# Patient Record
Sex: Male | Born: 1950 | Race: White | Hispanic: No
Health system: Southern US, Community
[De-identification: ages and names within clinical notes are randomized; demographics above are authoritative.]

---

## 2014-03-12 ENCOUNTER — Institutional Professional Consult (permissible substitution)
Admission: AD | Admit: 2014-03-12 | Discharge: 2014-04-10 | Disposition: A | Discharge status: Skilled Nursing Facility | Payer: Self-pay | Source: Ambulatory Visit | Attending: Internal Medicine | Admitting: Internal Medicine

## 2014-03-13 ENCOUNTER — Other Ambulatory Visit (HOSPITAL_COMMUNITY): Payer: Self-pay

## 2014-03-13 LAB — COMPREHENSIVE METABOLIC PANEL
ALT: 11 U/L (ref 0–53)
AST: 10 U/L (ref 0–37)
Albumin: 2.8 g/dL — ABNORMAL LOW (ref 3.5–5.2)
Alkaline Phosphatase: 84 U/L (ref 39–117)
BUN: 61 mg/dL — ABNORMAL HIGH (ref 6–23)
CALCIUM: 8.3 mg/dL — AB (ref 8.4–10.5)
CO2: 19 mEq/L (ref 19–32)
CREATININE: 2.7 mg/dL — AB (ref 0.50–1.35)
Chloride: 98 mEq/L (ref 96–112)
GFR calc Af Amer: 28 mL/min — ABNORMAL LOW (ref >90)
GFR calc non Af Amer: 24 mL/min — ABNORMAL LOW (ref >90)
Glucose, Bld: 106 mg/dL — ABNORMAL HIGH (ref 70–99)
Potassium: 3.5 mEq/L — ABNORMAL LOW (ref 3.7–5.3)
Sodium: 137 mEq/L (ref 137–147)
TOTAL PROTEIN: 6.3 g/dL (ref 6.0–8.3)
Total Bilirubin: 0.3 mg/dL (ref 0.3–1.2)

## 2014-03-13 LAB — CBC
HCT: 26.9 % — ABNORMAL LOW (ref 39.0–52.0)
Hemoglobin: 8.9 g/dL — ABNORMAL LOW (ref 13.0–17.0)
MCH: 30.4 pg (ref 26.0–34.0)
MCHC: 33.1 g/dL (ref 30.0–36.0)
MCV: 91.8 fL (ref 78.0–100.0)
PLATELETS: 238 10*3/uL (ref 150–400)
RBC: 2.93 MIL/uL — ABNORMAL LOW (ref 4.22–5.81)
RDW: 15.9 % — ABNORMAL HIGH (ref 11.5–15.5)
WBC: 8.2 10*3/uL (ref 4.0–10.5)

## 2014-03-13 LAB — PROCALCITONIN: PROCALCITONIN: 0.14 ng/mL

## 2014-03-13 LAB — TSH: TSH: 1.79 u[IU]/mL (ref 0.350–4.500)

## 2014-03-14 LAB — BASIC METABOLIC PANEL
BUN: 56 mg/dL — ABNORMAL HIGH (ref 6–23)
CO2: 22 mEq/L (ref 19–32)
Calcium: 8.2 mg/dL — ABNORMAL LOW (ref 8.4–10.5)
Chloride: 101 mEq/L (ref 96–112)
Creatinine, Ser: 2.84 mg/dL — ABNORMAL HIGH (ref 0.50–1.35)
GFR calc Af Amer: 26 mL/min — ABNORMAL LOW (ref >90)
GFR calc non Af Amer: 22 mL/min — ABNORMAL LOW (ref >90)
GLUCOSE: 76 mg/dL (ref 70–99)
POTASSIUM: 3.7 meq/L (ref 3.7–5.3)
Sodium: 139 mEq/L (ref 137–147)

## 2014-03-14 LAB — PRO B NATRIURETIC PEPTIDE: PRO B NATRI PEPTIDE: 41834 pg/mL — AB (ref 0–125)

## 2014-03-15 ENCOUNTER — Other Ambulatory Visit (HOSPITAL_COMMUNITY): Payer: Self-pay

## 2014-03-15 LAB — BASIC METABOLIC PANEL
BUN: 53 mg/dL — ABNORMAL HIGH (ref 6–23)
BUN: 55 mg/dL — AB (ref 6–23)
CALCIUM: 8.4 mg/dL (ref 8.4–10.5)
CHLORIDE: 100 meq/L (ref 96–112)
CO2: 21 meq/L (ref 19–32)
CO2: 22 mEq/L (ref 19–32)
CREATININE: 3.04 mg/dL — AB (ref 0.50–1.35)
Calcium: 8.2 mg/dL — ABNORMAL LOW (ref 8.4–10.5)
Chloride: 101 mEq/L (ref 96–112)
Creatinine, Ser: 3.24 mg/dL — ABNORMAL HIGH (ref 0.50–1.35)
GFR calc non Af Amer: 21 mL/min — ABNORMAL LOW (ref >90)
GFR, EST AFRICAN AMERICAN: 24 mL/min — AB (ref >90)
GFR, EST AFRICAN AMERICAN: 22 mL/min — AB (ref >90)
GFR, EST NON AFRICAN AMERICAN: 19 mL/min — AB (ref >90)
Glucose, Bld: 132 mg/dL — ABNORMAL HIGH (ref 70–99)
Glucose, Bld: 109 mg/dL — ABNORMAL HIGH (ref 70–99)
POTASSIUM: 3.6 meq/L — AB (ref 3.7–5.3)
Potassium: 3.8 mEq/L (ref 3.7–5.3)
SODIUM: 138 meq/L (ref 137–147)
Sodium: 140 mEq/L (ref 137–147)

## 2014-03-15 LAB — CBC
HCT: 25.8 % — ABNORMAL LOW (ref 39.0–52.0)
Hemoglobin: 8.5 g/dL — ABNORMAL LOW (ref 13.0–17.0)
MCH: 30.5 pg (ref 26.0–34.0)
MCHC: 32.9 g/dL (ref 30.0–36.0)
MCV: 92.5 fL (ref 78.0–100.0)
Platelets: 228 10*3/uL (ref 150–400)
RBC: 2.79 MIL/uL — AB (ref 4.22–5.81)
RDW: 16.1 % — ABNORMAL HIGH (ref 11.5–15.5)
WBC: 5.4 10*3/uL (ref 4.0–10.5)

## 2014-03-16 LAB — FERRITIN: Ferritin: 436 ng/mL — ABNORMAL HIGH (ref 22–322)

## 2014-03-16 LAB — BASIC METABOLIC PANEL
BUN: 52 mg/dL — AB (ref 6–23)
CO2: 20 mEq/L (ref 19–32)
Calcium: 8.2 mg/dL — ABNORMAL LOW (ref 8.4–10.5)
Chloride: 102 mEq/L (ref 96–112)
Creatinine, Ser: 3.04 mg/dL — ABNORMAL HIGH (ref 0.50–1.35)
GFR calc Af Amer: 24 mL/min — ABNORMAL LOW (ref >90)
GFR, EST NON AFRICAN AMERICAN: 21 mL/min — AB (ref >90)
GLUCOSE: 144 mg/dL — AB (ref 70–99)
Potassium: 3.9 mEq/L (ref 3.7–5.3)
Sodium: 139 mEq/L (ref 137–147)

## 2014-03-16 LAB — URINALYSIS, ROUTINE W REFLEX MICROSCOPIC
BILIRUBIN URINE: NEGATIVE
Glucose, UA: 100 mg/dL — AB
Hgb urine dipstick: NEGATIVE
Ketones, ur: NEGATIVE mg/dL
Leukocytes, UA: NEGATIVE
Nitrite: NEGATIVE
PH: 6 (ref 5.0–8.0)
Protein, ur: >300 mg/dL — AB
Specific Gravity, Urine: 1.02 (ref 1.005–1.030)
UROBILINOGEN UA: 0.2 mg/dL (ref 0.0–1.0)

## 2014-03-16 LAB — IRON AND TIBC
IRON: 64 ug/dL (ref 42–135)
Saturation Ratios: 22 % (ref 20–55)
TIBC: 287 ug/dL (ref 215–435)
UIBC: 223 ug/dL (ref 125–400)

## 2014-03-16 LAB — URINE MICROSCOPIC-ADD ON

## 2014-03-17 LAB — PROTEIN ELECTROPHORESIS, SERUM
ALBUMIN ELP: 54.8 % — AB (ref 55.8–66.1)
ALPHA-1-GLOBULIN: 7.2 % — AB (ref 2.9–4.9)
ALPHA-2-GLOBULIN: 12.3 % — AB (ref 7.1–11.8)
BETA 2: 5.4 % (ref 3.2–6.5)
BETA GLOBULIN: 6 % (ref 4.7–7.2)
GAMMA GLOBULIN: 14.3 % (ref 11.1–18.8)
M-Spike, %: NOT DETECTED g/dL
Total Protein ELP: 5.9 g/dL — ABNORMAL LOW (ref 6.0–8.3)

## 2014-03-17 LAB — URINE CULTURE
COLONY COUNT: NO GROWTH
CULTURE: NO GROWTH

## 2014-03-17 LAB — BASIC METABOLIC PANEL
BUN: 52 mg/dL — AB (ref 6–23)
CO2: 19 mEq/L (ref 19–32)
CREATININE: 3.16 mg/dL — AB (ref 0.50–1.35)
Calcium: 8.3 mg/dL — ABNORMAL LOW (ref 8.4–10.5)
Chloride: 103 mEq/L (ref 96–112)
GFR calc Af Amer: 23 mL/min — ABNORMAL LOW (ref >90)
GFR, EST NON AFRICAN AMERICAN: 20 mL/min — AB (ref >90)
GLUCOSE: 124 mg/dL — AB (ref 70–99)
Potassium: 4 mEq/L (ref 3.7–5.3)
Sodium: 139 mEq/L (ref 137–147)

## 2014-03-17 LAB — PREALBUMIN: Prealbumin: 29 mg/dL (ref 17.0–34.0)

## 2014-03-18 LAB — BASIC METABOLIC PANEL
BUN: 52 mg/dL — AB (ref 6–23)
CHLORIDE: 103 meq/L (ref 96–112)
CO2: 21 mEq/L (ref 19–32)
Calcium: 8.5 mg/dL (ref 8.4–10.5)
Creatinine, Ser: 3.17 mg/dL — ABNORMAL HIGH (ref 0.50–1.35)
GFR calc Af Amer: 23 mL/min — ABNORMAL LOW (ref >90)
GFR, EST NON AFRICAN AMERICAN: 20 mL/min — AB (ref >90)
GLUCOSE: 95 mg/dL (ref 70–99)
POTASSIUM: 3.7 meq/L (ref 3.7–5.3)
SODIUM: 140 meq/L (ref 137–147)

## 2014-03-19 LAB — CBC WITH DIFFERENTIAL/PLATELET
Basophils Absolute: 0 10*3/uL (ref 0.0–0.1)
Basophils Relative: 0 % (ref 0–1)
EOS ABS: 0.1 10*3/uL (ref 0.0–0.7)
Eosinophils Relative: 3 % (ref 0–5)
HCT: 24.6 % — ABNORMAL LOW (ref 39.0–52.0)
HEMOGLOBIN: 8 g/dL — AB (ref 13.0–17.0)
LYMPHS ABS: 0.7 10*3/uL (ref 0.7–4.0)
LYMPHS PCT: 15 % (ref 12–46)
MCH: 30.2 pg (ref 26.0–34.0)
MCHC: 32.5 g/dL (ref 30.0–36.0)
MCV: 92.8 fL (ref 78.0–100.0)
MONOS PCT: 8 % (ref 3–12)
Monocytes Absolute: 0.4 10*3/uL (ref 0.1–1.0)
Neutro Abs: 3.4 10*3/uL (ref 1.7–7.7)
Neutrophils Relative %: 74 % (ref 43–77)
PLATELETS: 200 10*3/uL (ref 150–400)
RBC: 2.65 MIL/uL — AB (ref 4.22–5.81)
RDW: 16.1 % — ABNORMAL HIGH (ref 11.5–15.5)
WBC: 4.6 10*3/uL (ref 4.0–10.5)

## 2014-03-19 LAB — UIFE/LIGHT CHAINS/TP QN, 24-HR UR
ALPHA 1 UR: DETECTED — AB
Albumin, U: DETECTED
Alpha 2, Urine: DETECTED — AB
BETA UR: DETECTED — AB
FREE KAPPA LT CHAINS, UR: 21 mg/dL — AB (ref 0.14–2.42)
Free Kappa/Lambda Ratio: 8.02 ratio (ref 2.04–10.37)
Free Lambda Lt Chains,Ur: 2.62 mg/dL — ABNORMAL HIGH (ref 0.02–0.67)
GAMMA UR: DETECTED — AB
Total Protein, Urine: 204.3 mg/dL

## 2014-03-19 LAB — RENAL FUNCTION PANEL
ALBUMIN: 2.7 g/dL — AB (ref 3.5–5.2)
BUN: 46 mg/dL — ABNORMAL HIGH (ref 6–23)
CHLORIDE: 103 meq/L (ref 96–112)
CO2: 20 meq/L (ref 19–32)
CREATININE: 3.01 mg/dL — AB (ref 0.50–1.35)
Calcium: 8.6 mg/dL (ref 8.4–10.5)
GFR calc Af Amer: 24 mL/min — ABNORMAL LOW (ref >90)
GFR calc non Af Amer: 21 mL/min — ABNORMAL LOW (ref >90)
Glucose, Bld: 115 mg/dL — ABNORMAL HIGH (ref 70–99)
POTASSIUM: 4.1 meq/L (ref 3.7–5.3)
Phosphorus: 4.2 mg/dL (ref 2.3–4.6)
Sodium: 140 mEq/L (ref 137–147)

## 2014-03-21 ENCOUNTER — Other Ambulatory Visit (HOSPITAL_COMMUNITY): Payer: Self-pay

## 2014-03-21 LAB — BASIC METABOLIC PANEL
BUN: 47 mg/dL — ABNORMAL HIGH (ref 6–23)
CALCIUM: 9.3 mg/dL (ref 8.4–10.5)
CO2: 18 meq/L — AB (ref 19–32)
CREATININE: 2.88 mg/dL — AB (ref 0.50–1.35)
Chloride: 102 mEq/L (ref 96–112)
GFR calc Af Amer: 26 mL/min — ABNORMAL LOW (ref >90)
GFR calc non Af Amer: 22 mL/min — ABNORMAL LOW (ref >90)
GLUCOSE: 109 mg/dL — AB (ref 70–99)
Potassium: 3.9 mEq/L (ref 3.7–5.3)
Sodium: 137 mEq/L (ref 137–147)

## 2014-03-21 LAB — CBC WITH DIFFERENTIAL/PLATELET
BASOS ABS: 0 10*3/uL (ref 0.0–0.1)
Basophils Relative: 0 % (ref 0–1)
EOS PCT: 3 % (ref 0–5)
Eosinophils Absolute: 0.2 10*3/uL (ref 0.0–0.7)
HCT: 26.8 % — ABNORMAL LOW (ref 39.0–52.0)
HEMOGLOBIN: 8.7 g/dL — AB (ref 13.0–17.0)
LYMPHS ABS: 0.8 10*3/uL (ref 0.7–4.0)
Lymphocytes Relative: 14 % (ref 12–46)
MCH: 30.3 pg (ref 26.0–34.0)
MCHC: 32.5 g/dL (ref 30.0–36.0)
MCV: 93.4 fL (ref 78.0–100.0)
MONO ABS: 0.3 10*3/uL (ref 0.1–1.0)
MONOS PCT: 5 % (ref 3–12)
NEUTROS ABS: 4.5 10*3/uL (ref 1.7–7.7)
Neutrophils Relative %: 78 % — ABNORMAL HIGH (ref 43–77)
Platelets: 253 10*3/uL (ref 150–400)
RBC: 2.87 MIL/uL — AB (ref 4.22–5.81)
RDW: 16 % — ABNORMAL HIGH (ref 11.5–15.5)
WBC: 5.8 10*3/uL (ref 4.0–10.5)

## 2014-03-21 LAB — MAGNESIUM: Magnesium: 2.1 mg/dL (ref 1.5–2.5)

## 2014-03-21 LAB — PHOSPHORUS: Phosphorus: 4.1 mg/dL (ref 2.3–4.6)

## 2014-03-22 ENCOUNTER — Other Ambulatory Visit (HOSPITAL_COMMUNITY): Payer: Self-pay

## 2014-03-22 LAB — BASIC METABOLIC PANEL
BUN: 48 mg/dL — AB (ref 6–23)
CHLORIDE: 100 meq/L (ref 96–112)
CO2: 21 mEq/L (ref 19–32)
Calcium: 9 mg/dL (ref 8.4–10.5)
Creatinine, Ser: 2.86 mg/dL — ABNORMAL HIGH (ref 0.50–1.35)
GFR, EST AFRICAN AMERICAN: 26 mL/min — AB (ref >90)
GFR, EST NON AFRICAN AMERICAN: 22 mL/min — AB (ref >90)
GLUCOSE: 108 mg/dL — AB (ref 70–99)
POTASSIUM: 4.4 meq/L (ref 3.7–5.3)
Sodium: 137 mEq/L (ref 137–147)

## 2014-03-22 LAB — CBC WITH DIFFERENTIAL/PLATELET
Basophils Absolute: 0 10*3/uL (ref 0.0–0.1)
Basophils Relative: 0 % (ref 0–1)
EOS ABS: 0.2 10*3/uL (ref 0.0–0.7)
Eosinophils Relative: 2 % (ref 0–5)
HCT: 25.6 % — ABNORMAL LOW (ref 39.0–52.0)
HEMOGLOBIN: 8.5 g/dL — AB (ref 13.0–17.0)
Lymphocytes Relative: 6 % — ABNORMAL LOW (ref 12–46)
Lymphs Abs: 0.4 10*3/uL — ABNORMAL LOW (ref 0.7–4.0)
MCH: 30.5 pg (ref 26.0–34.0)
MCHC: 33.2 g/dL (ref 30.0–36.0)
MCV: 91.8 fL (ref 78.0–100.0)
MONO ABS: 0.4 10*3/uL (ref 0.1–1.0)
MONOS PCT: 6 % (ref 3–12)
NEUTROS ABS: 5.7 10*3/uL (ref 1.7–7.7)
Neutrophils Relative %: 86 % — ABNORMAL HIGH (ref 43–77)
Platelets: 223 10*3/uL (ref 150–400)
RBC: 2.79 MIL/uL — ABNORMAL LOW (ref 4.22–5.81)
RDW: 15.7 % — ABNORMAL HIGH (ref 11.5–15.5)
WBC: 6.7 10*3/uL (ref 4.0–10.5)

## 2014-03-22 LAB — PTH, INTACT AND CALCIUM
CALCIUM TOTAL (PTH): 8.3 mg/dL — AB (ref 8.4–10.5)
PTH: 259.6 pg/mL — AB (ref 14.0–72.0)

## 2014-03-22 LAB — PREALBUMIN: Prealbumin: 29.8 mg/dL (ref 17.0–34.0)

## 2014-03-22 LAB — MAGNESIUM: Magnesium: 2 mg/dL (ref 1.5–2.5)

## 2014-03-22 LAB — PRO B NATRIURETIC PEPTIDE: Pro B Natriuretic peptide (BNP): 34034 pg/mL — ABNORMAL HIGH (ref 0–125)

## 2014-03-22 LAB — PHOSPHORUS: PHOSPHORUS: 3.7 mg/dL (ref 2.3–4.6)

## 2014-03-23 ENCOUNTER — Other Ambulatory Visit (HOSPITAL_COMMUNITY): Payer: Self-pay

## 2014-03-23 LAB — BLOOD GAS, ARTERIAL
Acid-base deficit: 4 mmol/L — ABNORMAL HIGH (ref 0.0–2.0)
BICARBONATE: 20.3 meq/L (ref 20.0–24.0)
O2 Content: 4 L/min
O2 SAT: 99 %
PO2 ART: 87.2 mmHg (ref 80.0–100.0)
Patient temperature: 97.9
TCO2: 21.4 mmol/L (ref 0–100)
pCO2 arterial: 34.7 mmHg — ABNORMAL LOW (ref 35.0–45.0)
pH, Arterial: 7.383 (ref 7.350–7.450)

## 2014-03-23 LAB — CBC
HCT: 28.1 % — ABNORMAL LOW (ref 39.0–52.0)
Hemoglobin: 9.2 g/dL — ABNORMAL LOW (ref 13.0–17.0)
MCH: 30.3 pg (ref 26.0–34.0)
MCHC: 32.7 g/dL (ref 30.0–36.0)
MCV: 92.4 fL (ref 78.0–100.0)
PLATELETS: 247 10*3/uL (ref 150–400)
RBC: 3.04 MIL/uL — ABNORMAL LOW (ref 4.22–5.81)
RDW: 16 % — AB (ref 11.5–15.5)
WBC: 9.1 10*3/uL (ref 4.0–10.5)

## 2014-03-23 LAB — BASIC METABOLIC PANEL
BUN: 47 mg/dL — ABNORMAL HIGH (ref 6–23)
CALCIUM: 9.1 mg/dL (ref 8.4–10.5)
CO2: 19 mEq/L (ref 19–32)
CREATININE: 2.72 mg/dL — AB (ref 0.50–1.35)
Chloride: 100 mEq/L (ref 96–112)
GFR calc non Af Amer: 24 mL/min — ABNORMAL LOW (ref >90)
GFR, EST AFRICAN AMERICAN: 27 mL/min — AB (ref >90)
Glucose, Bld: 103 mg/dL — ABNORMAL HIGH (ref 70–99)
Potassium: 3.9 mEq/L (ref 3.7–5.3)
Sodium: 138 mEq/L (ref 137–147)

## 2014-03-23 LAB — PHOSPHORUS: Phosphorus: 4.2 mg/dL (ref 2.3–4.6)

## 2014-03-23 LAB — MAGNESIUM: Magnesium: 2.1 mg/dL (ref 1.5–2.5)

## 2014-03-24 ENCOUNTER — Other Ambulatory Visit (HOSPITAL_COMMUNITY): Payer: Self-pay

## 2014-03-24 LAB — BASIC METABOLIC PANEL
BUN: 48 mg/dL — AB (ref 6–23)
CALCIUM: 8.9 mg/dL (ref 8.4–10.5)
CO2: 21 mEq/L (ref 19–32)
CREATININE: 2.89 mg/dL — AB (ref 0.50–1.35)
Chloride: 102 mEq/L (ref 96–112)
GFR calc Af Amer: 25 mL/min — ABNORMAL LOW (ref >90)
GFR calc non Af Amer: 22 mL/min — ABNORMAL LOW (ref >90)
Glucose, Bld: 106 mg/dL — ABNORMAL HIGH (ref 70–99)
Potassium: 3.9 mEq/L (ref 3.7–5.3)
Sodium: 141 mEq/L (ref 137–147)

## 2014-03-24 LAB — BLOOD GAS, ARTERIAL
Acid-base deficit: 1.2 mmol/L (ref 0.0–2.0)
BICARBONATE: 23.2 meq/L (ref 20.0–24.0)
FIO2: 0.4 %
O2 Saturation: 94.1 %
PATIENT TEMPERATURE: 98.6
PH ART: 7.381 (ref 7.350–7.450)
PO2 ART: 73.9 mmHg — AB (ref 80.0–100.0)
TCO2: 24.4 mmol/L (ref 0–100)
pCO2 arterial: 40.1 mmHg (ref 35.0–45.0)

## 2014-03-24 LAB — CBC
HEMATOCRIT: 24.8 % — AB (ref 39.0–52.0)
Hemoglobin: 8.1 g/dL — ABNORMAL LOW (ref 13.0–17.0)
MCH: 30.2 pg (ref 26.0–34.0)
MCHC: 32.7 g/dL (ref 30.0–36.0)
MCV: 92.5 fL (ref 78.0–100.0)
Platelets: 223 10*3/uL (ref 150–400)
RBC: 2.68 MIL/uL — ABNORMAL LOW (ref 4.22–5.81)
RDW: 15.9 % — AB (ref 11.5–15.5)
WBC: 5.8 10*3/uL (ref 4.0–10.5)

## 2014-03-24 LAB — MAGNESIUM: MAGNESIUM: 2.2 mg/dL (ref 1.5–2.5)

## 2014-03-24 LAB — PHOSPHORUS: Phosphorus: 4.8 mg/dL — ABNORMAL HIGH (ref 2.3–4.6)

## 2014-03-25 ENCOUNTER — Other Ambulatory Visit (HOSPITAL_COMMUNITY): Payer: Self-pay

## 2014-03-25 LAB — BASIC METABOLIC PANEL
BUN: 50 mg/dL — AB (ref 6–23)
CO2: 22 meq/L (ref 19–32)
Calcium: 8.7 mg/dL (ref 8.4–10.5)
Chloride: 103 mEq/L (ref 96–112)
Creatinine, Ser: 2.99 mg/dL — ABNORMAL HIGH (ref 0.50–1.35)
GFR calc Af Amer: 24 mL/min — ABNORMAL LOW (ref >90)
GFR, EST NON AFRICAN AMERICAN: 21 mL/min — AB (ref >90)
GLUCOSE: 92 mg/dL (ref 70–99)
POTASSIUM: 3.7 meq/L (ref 3.7–5.3)
SODIUM: 140 meq/L (ref 137–147)

## 2014-03-25 LAB — CBC
HEMATOCRIT: 24.5 % — AB (ref 39.0–52.0)
HEMOGLOBIN: 7.9 g/dL — AB (ref 13.0–17.0)
MCH: 29.8 pg (ref 26.0–34.0)
MCHC: 32.2 g/dL (ref 30.0–36.0)
MCV: 92.5 fL (ref 78.0–100.0)
Platelets: 221 10*3/uL (ref 150–400)
RBC: 2.65 MIL/uL — AB (ref 4.22–5.81)
RDW: 15.6 % — ABNORMAL HIGH (ref 11.5–15.5)
WBC: 6.3 10*3/uL (ref 4.0–10.5)

## 2014-03-27 LAB — BASIC METABOLIC PANEL
BUN: 51 mg/dL — AB (ref 6–23)
CO2: 21 meq/L (ref 19–32)
CREATININE: 2.67 mg/dL — AB (ref 0.50–1.35)
Calcium: 8.8 mg/dL (ref 8.4–10.5)
Chloride: 103 mEq/L (ref 96–112)
GFR calc Af Amer: 28 mL/min — ABNORMAL LOW (ref >90)
GFR calc non Af Amer: 24 mL/min — ABNORMAL LOW (ref >90)
Glucose, Bld: 123 mg/dL — ABNORMAL HIGH (ref 70–99)
POTASSIUM: 3.7 meq/L (ref 3.7–5.3)
Sodium: 139 mEq/L (ref 137–147)

## 2014-03-27 LAB — HEMOGLOBIN AND HEMATOCRIT, BLOOD
HCT: 25.7 % — ABNORMAL LOW (ref 39.0–52.0)
Hemoglobin: 8.5 g/dL — ABNORMAL LOW (ref 13.0–17.0)

## 2014-03-29 LAB — COMPREHENSIVE METABOLIC PANEL
ALT: 11 U/L (ref 0–53)
AST: 11 U/L (ref 0–37)
Albumin: 3.1 g/dL — ABNORMAL LOW (ref 3.5–5.2)
Alkaline Phosphatase: 107 U/L (ref 39–117)
BILIRUBIN TOTAL: 0.4 mg/dL (ref 0.3–1.2)
BUN: 51 mg/dL — AB (ref 6–23)
CHLORIDE: 102 meq/L (ref 96–112)
CO2: 20 meq/L (ref 19–32)
Calcium: 8.8 mg/dL (ref 8.4–10.5)
Creatinine, Ser: 2.72 mg/dL — ABNORMAL HIGH (ref 0.50–1.35)
GFR calc non Af Amer: 24 mL/min — ABNORMAL LOW (ref >90)
GFR, EST AFRICAN AMERICAN: 27 mL/min — AB (ref >90)
Glucose, Bld: 115 mg/dL — ABNORMAL HIGH (ref 70–99)
Potassium: 3.9 mEq/L (ref 3.7–5.3)
Sodium: 138 mEq/L (ref 137–147)
Total Protein: 6.6 g/dL (ref 6.0–8.3)

## 2014-03-29 LAB — CBC
HEMATOCRIT: 25.5 % — AB (ref 39.0–52.0)
HEMOGLOBIN: 8.3 g/dL — AB (ref 13.0–17.0)
MCH: 30.2 pg (ref 26.0–34.0)
MCHC: 32.5 g/dL (ref 30.0–36.0)
MCV: 92.7 fL (ref 78.0–100.0)
PLATELETS: 242 10*3/uL (ref 150–400)
RBC: 2.75 MIL/uL — AB (ref 4.22–5.81)
RDW: 15.7 % — ABNORMAL HIGH (ref 11.5–15.5)
WBC: 7.6 10*3/uL (ref 4.0–10.5)

## 2014-03-29 LAB — PREALBUMIN: Prealbumin: 28.1 mg/dL (ref 17.0–34.0)

## 2014-03-31 ENCOUNTER — Other Ambulatory Visit (HOSPITAL_COMMUNITY): Payer: Self-pay

## 2014-03-31 LAB — BASIC METABOLIC PANEL
BUN: 45 mg/dL — ABNORMAL HIGH (ref 6–23)
CALCIUM: 9 mg/dL (ref 8.4–10.5)
CO2: 21 mEq/L (ref 19–32)
Chloride: 101 mEq/L (ref 96–112)
Creatinine, Ser: 2.73 mg/dL — ABNORMAL HIGH (ref 0.50–1.35)
GFR calc non Af Amer: 24 mL/min — ABNORMAL LOW (ref >90)
GFR, EST AFRICAN AMERICAN: 27 mL/min — AB (ref >90)
GLUCOSE: 119 mg/dL — AB (ref 70–99)
Potassium: 4.5 mEq/L (ref 3.7–5.3)
SODIUM: 136 meq/L — AB (ref 137–147)

## 2014-03-31 LAB — CK TOTAL AND CKMB (NOT AT ARMC)
CK, MB: 4.4 ng/mL — ABNORMAL HIGH (ref 0.3–4.0)
CK, MB: 4.3 ng/mL — AB (ref 0.3–4.0)
RELATIVE INDEX: INVALID (ref 0.0–2.5)
Relative Index: INVALID (ref 0.0–2.5)
Total CK: 34 U/L (ref 7–232)
Total CK: 32 U/L (ref 7–232)

## 2014-03-31 LAB — TROPONIN I: Troponin I: <0.3 ng/mL (ref <0.30)

## 2014-03-31 LAB — PRO B NATRIURETIC PEPTIDE: PRO B NATRI PEPTIDE: 23115 pg/mL — AB (ref 0–125)

## 2014-04-01 ENCOUNTER — Other Ambulatory Visit (HOSPITAL_COMMUNITY): Payer: Self-pay

## 2014-04-01 LAB — BASIC METABOLIC PANEL
BUN: 44 mg/dL — ABNORMAL HIGH (ref 6–23)
CO2: 22 mEq/L (ref 19–32)
Calcium: 9.4 mg/dL (ref 8.4–10.5)
Chloride: 101 mEq/L (ref 96–112)
Creatinine, Ser: 2.74 mg/dL — ABNORMAL HIGH (ref 0.50–1.35)
GFR calc Af Amer: 27 mL/min — ABNORMAL LOW (ref >90)
GFR, EST NON AFRICAN AMERICAN: 23 mL/min — AB (ref >90)
GLUCOSE: 114 mg/dL — AB (ref 70–99)
POTASSIUM: 4.4 meq/L (ref 3.7–5.3)
Sodium: 138 mEq/L (ref 137–147)

## 2014-04-01 LAB — CBC
HCT: 27.6 % — ABNORMAL LOW (ref 39.0–52.0)
HEMOGLOBIN: 9.1 g/dL — AB (ref 13.0–17.0)
MCH: 30.8 pg (ref 26.0–34.0)
MCHC: 33 g/dL (ref 30.0–36.0)
MCV: 93.6 fL (ref 78.0–100.0)
PLATELETS: 290 10*3/uL (ref 150–400)
RBC: 2.95 MIL/uL — AB (ref 4.22–5.81)
RDW: 16 % — ABNORMAL HIGH (ref 11.5–15.5)
WBC: 8.6 10*3/uL (ref 4.0–10.5)

## 2014-04-02 LAB — CBC
HCT: 26.4 % — ABNORMAL LOW (ref 39.0–52.0)
HEMOGLOBIN: 8.6 g/dL — AB (ref 13.0–17.0)
MCH: 30.3 pg (ref 26.0–34.0)
MCHC: 32.6 g/dL (ref 30.0–36.0)
MCV: 93 fL (ref 78.0–100.0)
PLATELETS: 260 10*3/uL (ref 150–400)
RBC: 2.84 MIL/uL — ABNORMAL LOW (ref 4.22–5.81)
RDW: 16.1 % — ABNORMAL HIGH (ref 11.5–15.5)
WBC: 7.7 10*3/uL (ref 4.0–10.5)

## 2014-04-02 LAB — BASIC METABOLIC PANEL
BUN: 44 mg/dL — ABNORMAL HIGH (ref 6–23)
CALCIUM: 9.3 mg/dL (ref 8.4–10.5)
CHLORIDE: 101 meq/L (ref 96–112)
CO2: 20 meq/L (ref 19–32)
Creatinine, Ser: 2.74 mg/dL — ABNORMAL HIGH (ref 0.50–1.35)
GFR calc Af Amer: 27 mL/min — ABNORMAL LOW (ref >90)
GFR calc non Af Amer: 23 mL/min — ABNORMAL LOW (ref >90)
Glucose, Bld: 112 mg/dL — ABNORMAL HIGH (ref 70–99)
POTASSIUM: 4 meq/L (ref 3.7–5.3)
Sodium: 138 mEq/L (ref 137–147)

## 2014-04-03 ENCOUNTER — Other Ambulatory Visit (HOSPITAL_COMMUNITY): Payer: Self-pay

## 2014-04-03 LAB — BASIC METABOLIC PANEL
BUN: 45 mg/dL — AB (ref 6–23)
CHLORIDE: 102 meq/L (ref 96–112)
CO2: 21 meq/L (ref 19–32)
Calcium: 9 mg/dL (ref 8.4–10.5)
Creatinine, Ser: 2.6 mg/dL — ABNORMAL HIGH (ref 0.50–1.35)
GFR calc Af Amer: 29 mL/min — ABNORMAL LOW (ref >90)
GFR calc non Af Amer: 25 mL/min — ABNORMAL LOW (ref >90)
Glucose, Bld: 146 mg/dL — ABNORMAL HIGH (ref 70–99)
Potassium: 3.6 mEq/L — ABNORMAL LOW (ref 3.7–5.3)
Sodium: 139 mEq/L (ref 137–147)

## 2014-04-03 LAB — CBC
HEMATOCRIT: 24.7 % — AB (ref 39.0–52.0)
HEMOGLOBIN: 8.1 g/dL — AB (ref 13.0–17.0)
MCH: 30.3 pg (ref 26.0–34.0)
MCHC: 32.8 g/dL (ref 30.0–36.0)
MCV: 92.5 fL (ref 78.0–100.0)
Platelets: 222 10*3/uL (ref 150–400)
RBC: 2.67 MIL/uL — AB (ref 4.22–5.81)
RDW: 16.2 % — ABNORMAL HIGH (ref 11.5–15.5)
WBC: 8.7 10*3/uL (ref 4.0–10.5)

## 2014-04-04 ENCOUNTER — Other Ambulatory Visit (HOSPITAL_COMMUNITY): Payer: Self-pay

## 2014-04-04 LAB — BASIC METABOLIC PANEL
BUN: 44 mg/dL — ABNORMAL HIGH (ref 6–23)
CALCIUM: 9.3 mg/dL (ref 8.4–10.5)
CO2: 21 meq/L (ref 19–32)
CREATININE: 2.7 mg/dL — AB (ref 0.50–1.35)
Chloride: 100 mEq/L (ref 96–112)
GFR calc non Af Amer: 24 mL/min — ABNORMAL LOW (ref >90)
GFR, EST AFRICAN AMERICAN: 28 mL/min — AB (ref >90)
Glucose, Bld: 115 mg/dL — ABNORMAL HIGH (ref 70–99)
Potassium: 3.6 mEq/L — ABNORMAL LOW (ref 3.7–5.3)
Sodium: 138 mEq/L (ref 137–147)

## 2014-04-04 LAB — CBC
HEMATOCRIT: 27 % — AB (ref 39.0–52.0)
Hemoglobin: 8.8 g/dL — ABNORMAL LOW (ref 13.0–17.0)
MCH: 30.2 pg (ref 26.0–34.0)
MCHC: 32.6 g/dL (ref 30.0–36.0)
MCV: 92.8 fL (ref 78.0–100.0)
Platelets: 260 10*3/uL (ref 150–400)
RBC: 2.91 MIL/uL — ABNORMAL LOW (ref 4.22–5.81)
RDW: 16.5 % — AB (ref 11.5–15.5)
WBC: 9 10*3/uL (ref 4.0–10.5)

## 2014-04-05 LAB — BASIC METABOLIC PANEL
BUN: 44 mg/dL — ABNORMAL HIGH (ref 6–23)
CHLORIDE: 103 meq/L (ref 96–112)
CO2: 21 mEq/L (ref 19–32)
CREATININE: 2.74 mg/dL — AB (ref 0.50–1.35)
Calcium: 8.8 mg/dL (ref 8.4–10.5)
GFR calc non Af Amer: 23 mL/min — ABNORMAL LOW (ref >90)
GFR, EST AFRICAN AMERICAN: 27 mL/min — AB (ref >90)
Glucose, Bld: 108 mg/dL — ABNORMAL HIGH (ref 70–99)
Potassium: 3.6 mEq/L — ABNORMAL LOW (ref 3.7–5.3)
Sodium: 139 mEq/L (ref 137–147)

## 2014-04-05 LAB — CBC WITH DIFFERENTIAL/PLATELET
BASOS PCT: 1 % (ref 0–1)
Basophils Absolute: 0 10*3/uL (ref 0.0–0.1)
Eosinophils Absolute: 0.3 10*3/uL (ref 0.0–0.7)
Eosinophils Relative: 5 % (ref 0–5)
HCT: 24.4 % — ABNORMAL LOW (ref 39.0–52.0)
HEMOGLOBIN: 7.8 g/dL — AB (ref 13.0–17.0)
Lymphocytes Relative: 11 % — ABNORMAL LOW (ref 12–46)
Lymphs Abs: 0.7 10*3/uL (ref 0.7–4.0)
MCH: 30.4 pg (ref 26.0–34.0)
MCHC: 32 g/dL (ref 30.0–36.0)
MCV: 94.9 fL (ref 78.0–100.0)
Monocytes Absolute: 0.5 10*3/uL (ref 0.1–1.0)
Monocytes Relative: 8 % (ref 3–12)
Neutro Abs: 4.5 10*3/uL (ref 1.7–7.7)
Neutrophils Relative %: 75 % (ref 43–77)
Platelets: 227 10*3/uL (ref 150–400)
RBC: 2.57 MIL/uL — ABNORMAL LOW (ref 4.22–5.81)
RDW: 16.9 % — ABNORMAL HIGH (ref 11.5–15.5)
WBC: 5.9 10*3/uL (ref 4.0–10.5)

## 2014-04-05 LAB — BLOOD GAS, ARTERIAL
Acid-base deficit: 2.2 mmol/L — ABNORMAL HIGH (ref 0.0–2.0)
Bicarbonate: 21.7 mEq/L (ref 20.0–24.0)
O2 Content: 4 L/min
O2 Saturation: 98.5 %
PCO2 ART: 34.3 mmHg — AB (ref 35.0–45.0)
PH ART: 7.415 (ref 7.350–7.450)
Patient temperature: 98.1
TCO2: 22.7 mmol/L (ref 0–100)
pO2, Arterial: 96.4 mmHg (ref 80.0–100.0)

## 2014-04-05 LAB — PRO B NATRIURETIC PEPTIDE: Pro B Natriuretic peptide (BNP): 34893 pg/mL — ABNORMAL HIGH (ref 0–125)

## 2014-04-05 LAB — PREALBUMIN: PREALBUMIN: 19.6 mg/dL (ref 17.0–34.0)

## 2014-04-05 LAB — PHOSPHORUS: Phosphorus: 3.9 mg/dL (ref 2.3–4.6)

## 2014-04-05 LAB — MAGNESIUM: Magnesium: 2.2 mg/dL (ref 1.5–2.5)

## 2014-04-06 ENCOUNTER — Other Ambulatory Visit (HOSPITAL_COMMUNITY): Payer: Self-pay

## 2014-04-06 LAB — CBC
HCT: 25.4 % — ABNORMAL LOW (ref 39.0–52.0)
Hemoglobin: 8.2 g/dL — ABNORMAL LOW (ref 13.0–17.0)
MCH: 30.3 pg (ref 26.0–34.0)
MCHC: 32.3 g/dL (ref 30.0–36.0)
MCV: 93.7 fL (ref 78.0–100.0)
Platelets: 272 10*3/uL (ref 150–400)
RBC: 2.71 MIL/uL — ABNORMAL LOW (ref 4.22–5.81)
RDW: 16.9 % — AB (ref 11.5–15.5)
WBC: 6.8 10*3/uL (ref 4.0–10.5)

## 2014-04-06 LAB — BASIC METABOLIC PANEL
BUN: 45 mg/dL — ABNORMAL HIGH (ref 6–23)
CHLORIDE: 100 meq/L (ref 96–112)
CO2: 22 mEq/L (ref 19–32)
Calcium: 8.9 mg/dL (ref 8.4–10.5)
Creatinine, Ser: 2.93 mg/dL — ABNORMAL HIGH (ref 0.50–1.35)
GFR, EST AFRICAN AMERICAN: 25 mL/min — AB (ref >90)
GFR, EST NON AFRICAN AMERICAN: 22 mL/min — AB (ref >90)
Glucose, Bld: 103 mg/dL — ABNORMAL HIGH (ref 70–99)
POTASSIUM: 3.7 meq/L (ref 3.7–5.3)
SODIUM: 138 meq/L (ref 137–147)

## 2014-04-07 ENCOUNTER — Other Ambulatory Visit (HOSPITAL_COMMUNITY): Payer: Self-pay

## 2014-04-08 ENCOUNTER — Other Ambulatory Visit (HOSPITAL_COMMUNITY): Payer: Self-pay

## 2014-04-08 LAB — BASIC METABOLIC PANEL
BUN: 42 mg/dL — AB (ref 6–23)
CHLORIDE: 98 meq/L (ref 96–112)
CO2: 24 meq/L (ref 19–32)
Calcium: 8.9 mg/dL (ref 8.4–10.5)
Creatinine, Ser: 2.95 mg/dL — ABNORMAL HIGH (ref 0.50–1.35)
GFR calc Af Amer: 25 mL/min — ABNORMAL LOW (ref >90)
GFR calc non Af Amer: 21 mL/min — ABNORMAL LOW (ref >90)
GLUCOSE: 168 mg/dL — AB (ref 70–99)
POTASSIUM: 3.5 meq/L — AB (ref 3.7–5.3)
SODIUM: 139 meq/L (ref 137–147)

## 2014-04-08 LAB — CBC
HEMATOCRIT: 26.2 % — AB (ref 39.0–52.0)
Hemoglobin: 8.4 g/dL — ABNORMAL LOW (ref 13.0–17.0)
MCH: 30.5 pg (ref 26.0–34.0)
MCHC: 32.1 g/dL (ref 30.0–36.0)
MCV: 95.3 fL (ref 78.0–100.0)
Platelets: 264 10*3/uL (ref 150–400)
RBC: 2.75 MIL/uL — AB (ref 4.22–5.81)
RDW: 16.8 % — ABNORMAL HIGH (ref 11.5–15.5)
WBC: 7 10*3/uL (ref 4.0–10.5)

## 2014-04-08 LAB — PHOSPHORUS: PHOSPHORUS: 4.3 mg/dL (ref 2.3–4.6)

## 2014-04-08 LAB — PRO B NATRIURETIC PEPTIDE: Pro B Natriuretic peptide (BNP): 21979 pg/mL — ABNORMAL HIGH (ref 0–125)

## 2014-04-08 LAB — MAGNESIUM: Magnesium: 2 mg/dL (ref 1.5–2.5)

## 2014-08-17 IMAGING — US US RENAL
1 series · 14 of 25 positions shown · non-contrast
Comparison: None.

CLINICAL DATA: Chronic renal insufficiency, history of CHF

EXAM:
RENAL/URINARY TRACT ULTRASOUND COMPLETE

[Series 1: us renal · 0.21mm/px · 14 of 35 slices shown]
[im 1/35]
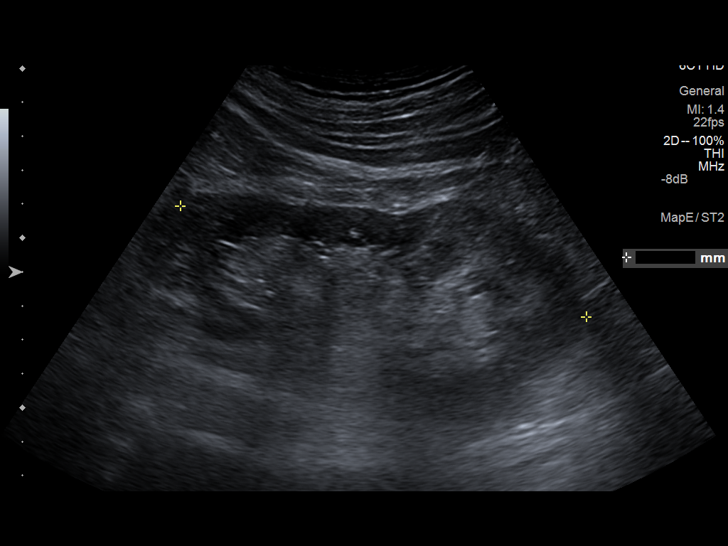
[im 3/35]
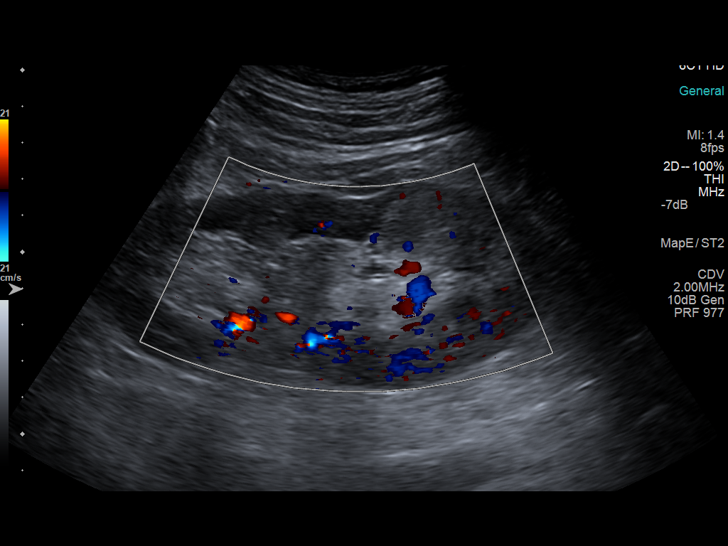
[im 6/35]
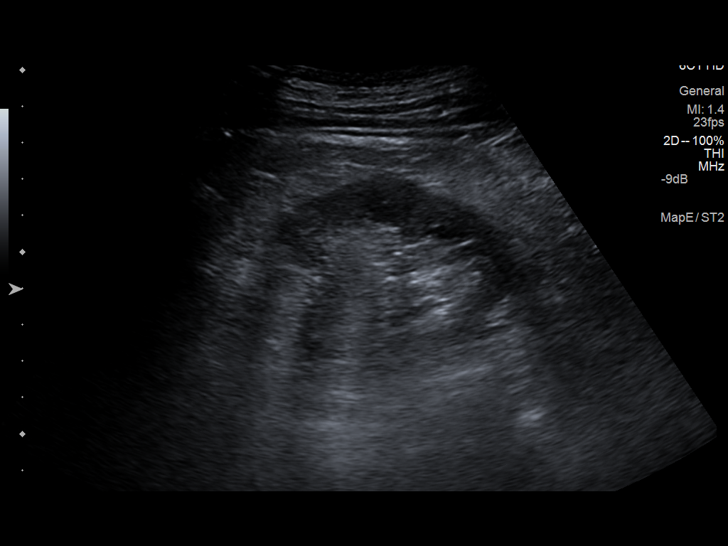
[im 9/35]
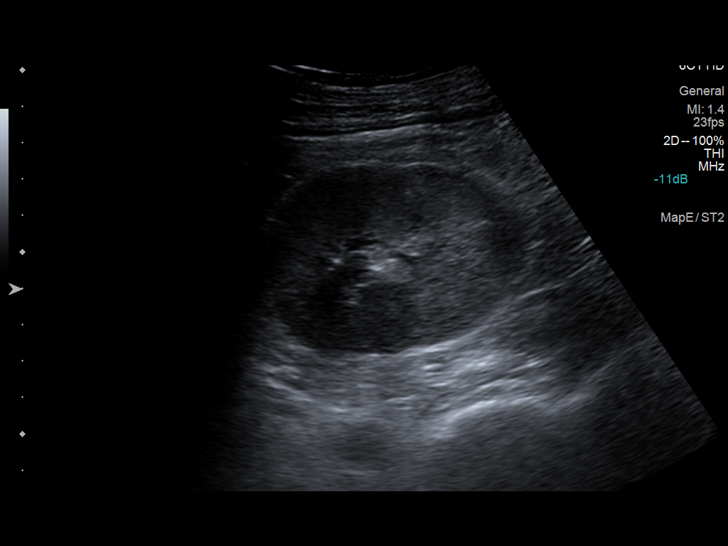
[im 12/35]
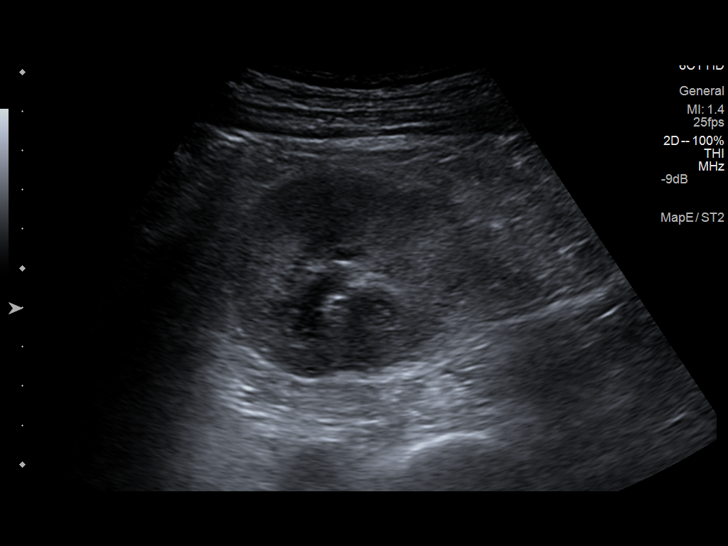
[im 13/35]
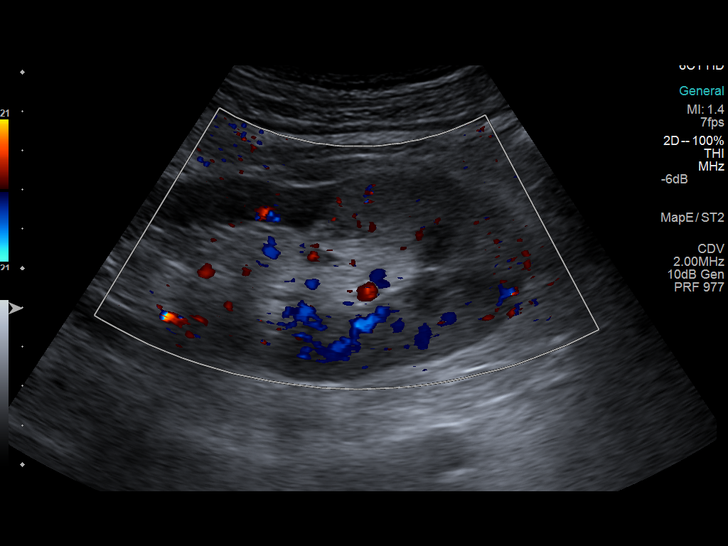
[im 16/35]
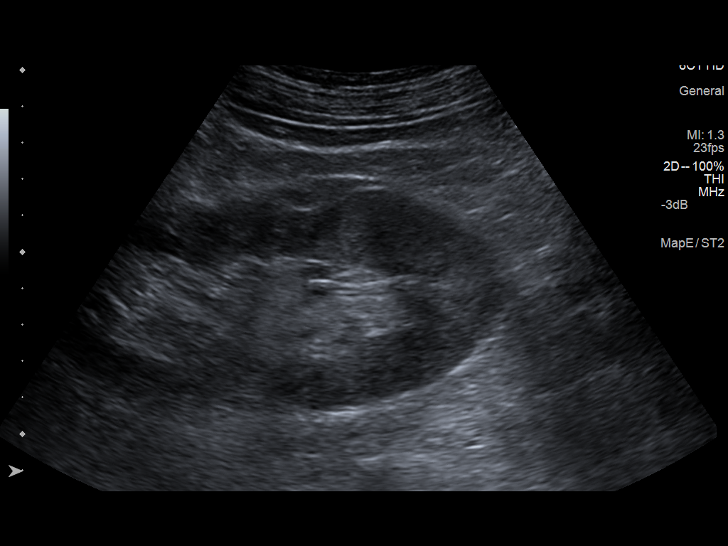
[im 19/35]
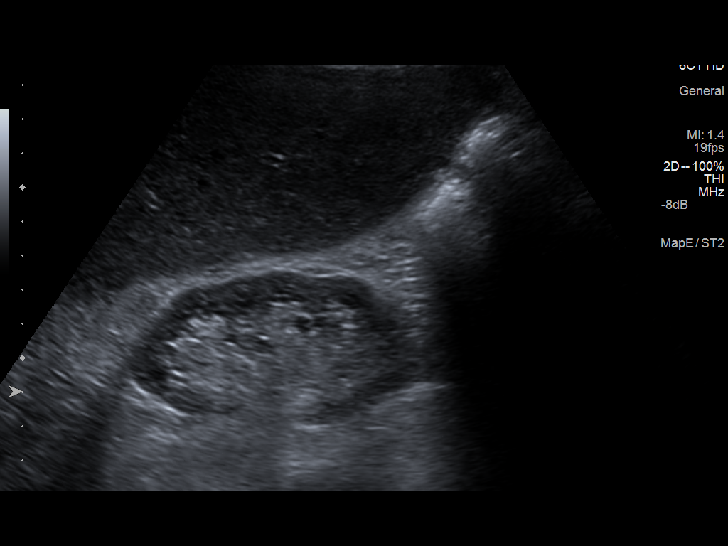
[im 22/35]
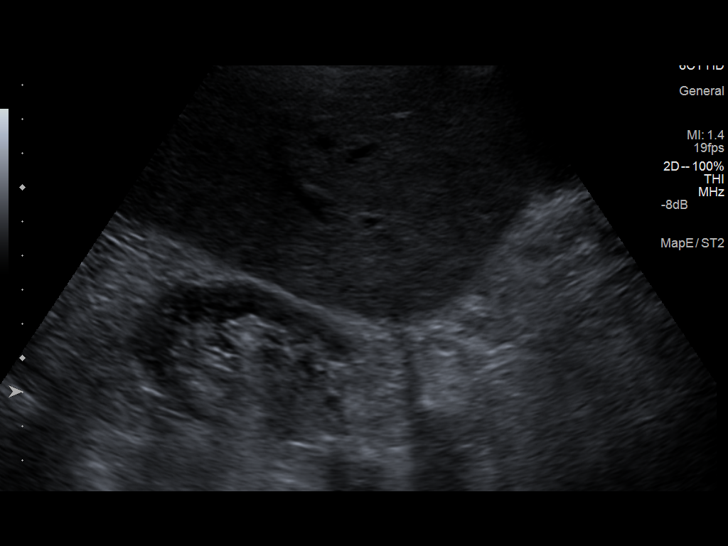
[im 23/35]
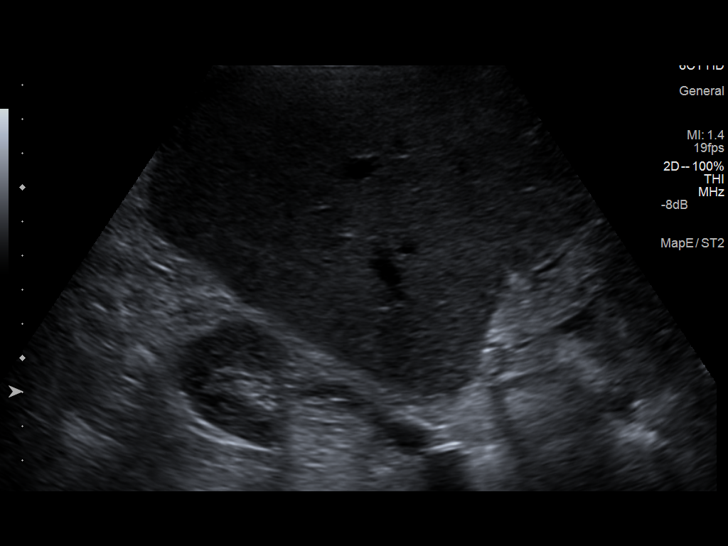
[im 26/35]
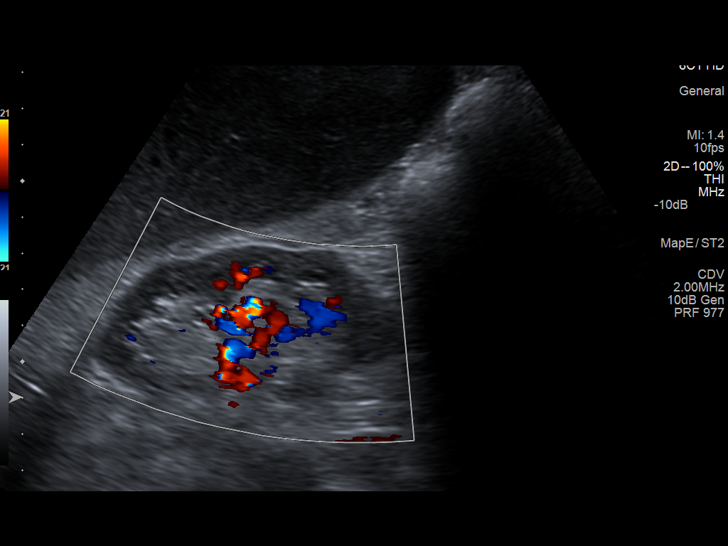
[im 29/35]
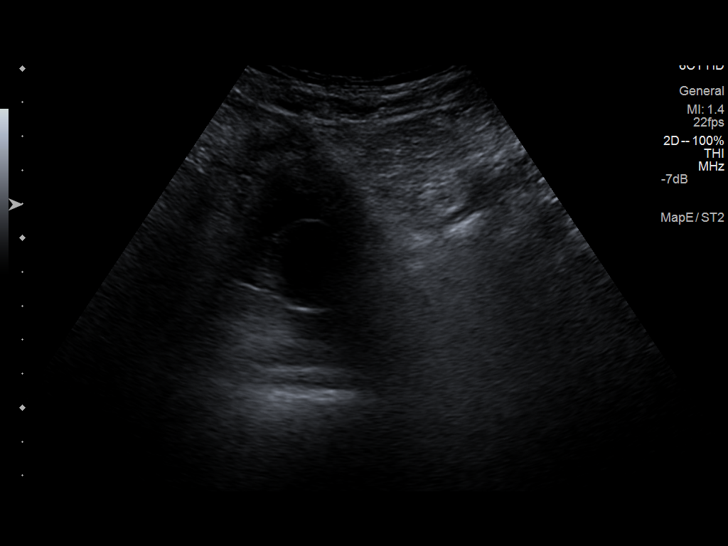
[im 32/35]
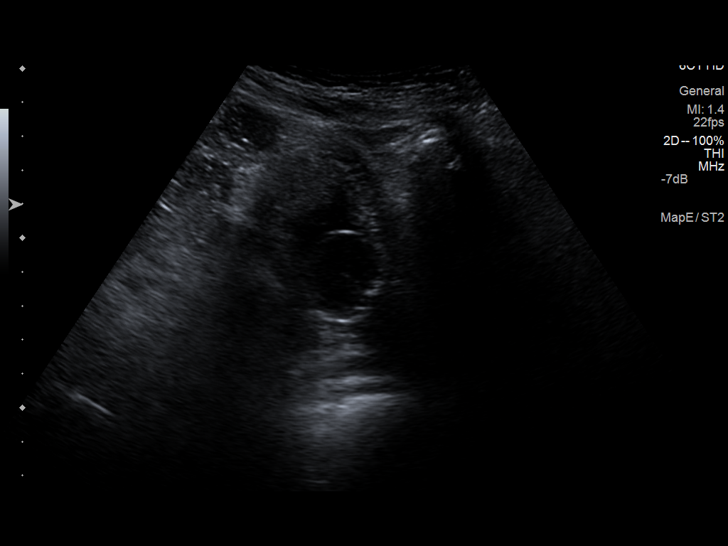
[im 35/35]
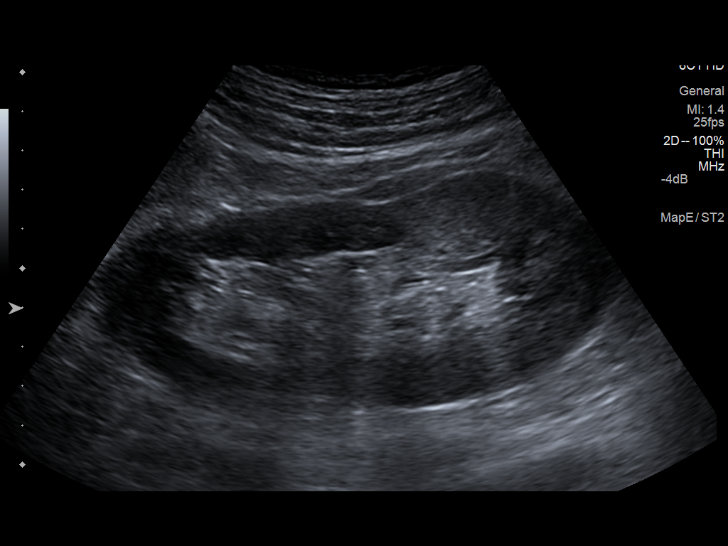

[14 of 25 positions shown; findings below may reference images not displayed]

FINDINGS: Right Kidney:

Length: 9 cm. There is diffuse thinning of the cortex. The
echotexture of the right renal cortex is approximately equal to that
of the adjacent liver.

Left Kidney:

Length: 12.4 cm.. The lower pole of the left kidney appears somewhat
swollen and its cortical echotexture is increased as compared to the
mid and upper pole cortex. There is no hydronephrosis.

Bladder:

A Foley catheter is present.  The urinary bladder is nondistended.
IMPRESSION: 1. The cortical echotexture of the lower pole of the left kidney is
diffusely increased and it is subjectively enlarged compared to the
upper and midpole. This is of uncertain significance. Correlation
with any signs of infection or vascular insult is needed. Follow-up
imaging with noncontrast CT scanning or MRI may be useful.
2. Neither kidney exhibits hydronephrosis. There is atrophy of the
right kidney with cortical thinning.

## 2014-09-09 IMAGING — CR DG CHEST 1V PORT
1 series · 1 of 1 positions shown · non-contrast
Comparison: DG CHEST 1V PORT dated 04/06/2014

CLINICAL DATA: CHF.

EXAM:
PORTABLE CHEST - 1 VIEW

[AP]
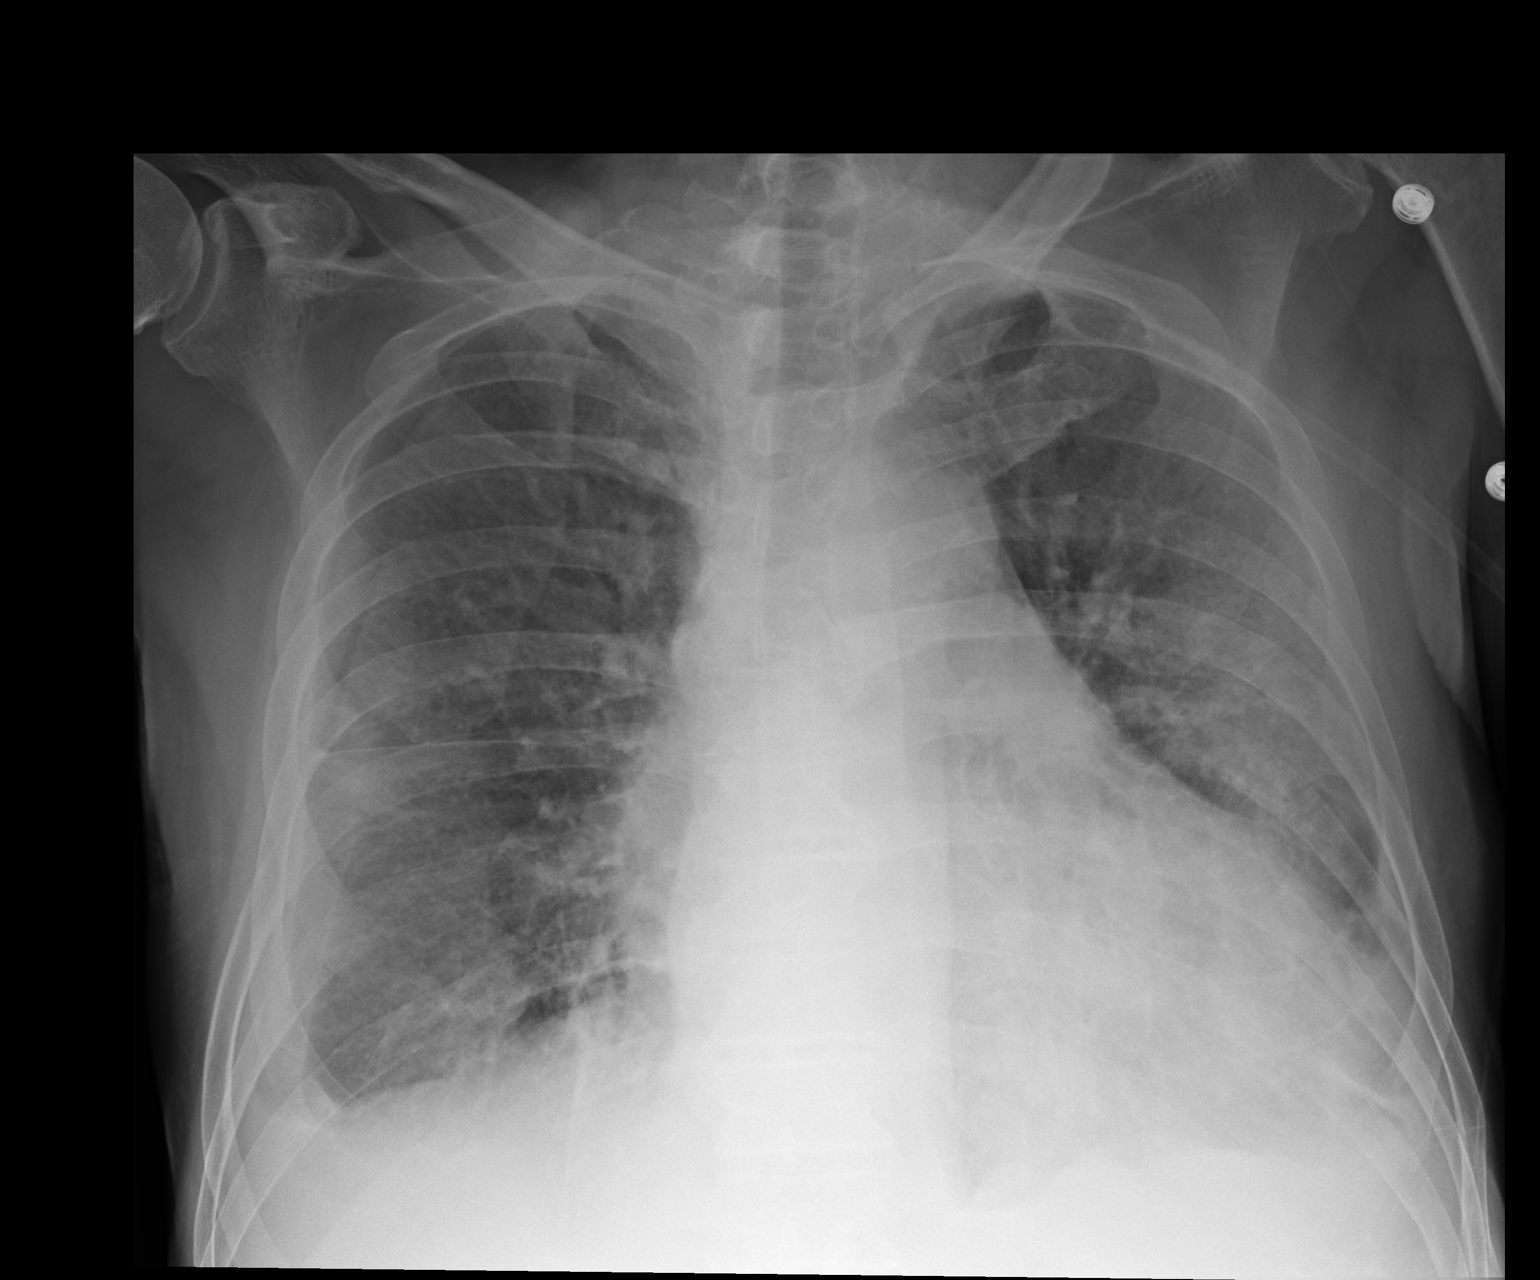

[1 of 1 positions shown; findings below may reference images not displayed]

FINDINGS: Cardiomegaly with pulmonary vascular prominence and bilateral
pulmonary alveolar prominence consistent with congestive heart
failure and pulmonary edema noted. Pleural effusions are noted. No
acute bony abnormality.
IMPRESSION: Congestive heart failure with bilateral pulmonary edema and pleural
effusions. Pulmonary edema has cleared slightly from prior exam.

## 2014-11-09 DEATH — deceased

## 2018-06-09 DEATH — deceased
